# Patient Record
Sex: Female | Born: 1970 | Race: White | Hispanic: No | Marital: Single | Smoking: Former smoker
Health system: Southern US, Community
[De-identification: ages and names within clinical notes are randomized; demographics above are authoritative.]

## PROBLEM LIST (undated history)

## (undated) DIAGNOSIS — F419 Anxiety disorder, unspecified: Secondary | ICD-10-CM

## (undated) DIAGNOSIS — M1990 Unspecified osteoarthritis, unspecified site: Secondary | ICD-10-CM

## (undated) DIAGNOSIS — Z9889 Other specified postprocedural states: Secondary | ICD-10-CM

## (undated) DIAGNOSIS — K219 Gastro-esophageal reflux disease without esophagitis: Secondary | ICD-10-CM

## (undated) DIAGNOSIS — R112 Nausea with vomiting, unspecified: Secondary | ICD-10-CM

## (undated) HISTORY — PX: BREAST SURGERY: SHX581

---

## 1998-01-21 HISTORY — PX: REDUCTION MAMMAPLASTY: SUR839

## 2013-04-21 DIAGNOSIS — F411 Generalized anxiety disorder: Secondary | ICD-10-CM | POA: Insufficient documentation

## 2014-07-06 ENCOUNTER — Ambulatory Visit: Payer: Self-pay | Admitting: Physician Assistant

## 2014-07-06 NOTE — H&P (Signed)
Sheena Evans is an 44 y.o. female.   Chief Complaint: left knee pain HPI: Left knee pain.  Two mechanical events to her knee over 48 hours.  Severe pop in the knees, hard to say where this pop occurred.  It is more central knee but it is also radiating from anterior posterior with some medial pain.    She had a second event as well.  Antalgic gait, difficult ambulation and unable to work.  Works as a phlebotomist.  Has Meloxicam left over from plantar fascia treatment.  We tried her back on.  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  Mechanical events.  Unable to work.  She works on her feet and is a phlebotomist for [employer].    No past medical history on file.  No past surgical history on file.  No family history on file. Social History:  has no tobacco, alcohol, and drug history on file.  Allergies: Allergies not on file   (Not in a hospital admission)  No results found for this or any previous visit (from the past 48 hour(s)). No results found.  Review of Systems  Genitourinary: Positive for dysuria and hematuria.  Musculoskeletal: Positive for joint pain.  All other systems reviewed and are negative.   There were no vitals taken for this visit. Physical Exam  Constitutional: She is oriented to person, place, and time. She appears well-developed and well-nourished. No distress.  HENT:  Head: Normocephalic and atraumatic.  Nose: Nose normal.  Eyes: Conjunctivae and EOM are normal. Pupils are equal, round, and reactive to light.  Neck: Normal range of motion. Neck supple.  Cardiovascular: Normal rate and intact distal pulses.   Respiratory: Effort normal. No respiratory distress. She has no wheezes.  GI: Soft. She exhibits no distension. There is no tenderness.  Musculoskeletal:       Left knee: She exhibits decreased range of motion. She exhibits no erythema, no LCL laxity and no MCL laxity. Tenderness found. Medial joint  line tenderness noted.  Lymphadenopathy:    She has no cervical adenopathy.  Neurological: She is alert and oriented to person, place, and time.  Skin: Skin is warm and dry. No rash noted. No erythema.  Psychiatric: She has a normal mood and affect. Her behavior is normal.     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    I would recommend arthroscopic evaluation.  She understands that this would hopefully be helpful but in the long term she needs to work on her BMI etc.    Total Disability, unable to work.  Hydrocodone now and may upgrade that after surgery.  Will decide on that at the time of surgery.  Risks and benefits discussed for knee arthroscopy, chondroplasty, medial meniscectomy.  Return to see me after surgery.    Sheena Evans 07/06/2014, 4:58 PM    

## 2014-07-11 ENCOUNTER — Encounter (HOSPITAL_BASED_OUTPATIENT_CLINIC_OR_DEPARTMENT_OTHER): Payer: Self-pay

## 2014-07-11 NOTE — Progress Notes (Signed)
Due to patients BMI of 46.95 (with stated weight) patient will come in for anesthesia evaluation. Patient will need weigh also.

## 2014-07-12 ENCOUNTER — Encounter (HOSPITAL_BASED_OUTPATIENT_CLINIC_OR_DEPARTMENT_OTHER): Payer: Self-pay

## 2014-07-12 NOTE — Progress Notes (Signed)
Patient in for anesthesia consult on airway. No problems noted per Dr. Sampson Goon. Weight 272 lbs. Pear Shaped. Airway appears clear.

## 2014-07-13 ENCOUNTER — Ambulatory Visit (HOSPITAL_BASED_OUTPATIENT_CLINIC_OR_DEPARTMENT_OTHER): Payer: 59 | Admitting: Anesthesiology

## 2014-07-13 ENCOUNTER — Encounter (HOSPITAL_BASED_OUTPATIENT_CLINIC_OR_DEPARTMENT_OTHER): Payer: Self-pay | Admitting: Certified Registered"

## 2014-07-13 ENCOUNTER — Encounter (HOSPITAL_BASED_OUTPATIENT_CLINIC_OR_DEPARTMENT_OTHER)
Admission: RE | Disposition: A | Discharge status: Home / Self Care | Payer: Self-pay | Source: Ambulatory Visit | Attending: Orthopedic Surgery

## 2014-07-13 ENCOUNTER — Ambulatory Visit (HOSPITAL_BASED_OUTPATIENT_CLINIC_OR_DEPARTMENT_OTHER)
Admission: RE | Admit: 2014-07-13 | Discharge: 2014-07-13 | Disposition: A | Discharge status: Home / Self Care | Payer: 59 | Source: Ambulatory Visit | Attending: Orthopedic Surgery | Admitting: Orthopedic Surgery

## 2014-07-13 DIAGNOSIS — Z87891 Personal history of nicotine dependence: Secondary | ICD-10-CM | POA: Diagnosis not present

## 2014-07-13 DIAGNOSIS — K219 Gastro-esophageal reflux disease without esophagitis: Secondary | ICD-10-CM | POA: Diagnosis not present

## 2014-07-13 DIAGNOSIS — Y998 Other external cause status: Secondary | ICD-10-CM | POA: Diagnosis not present

## 2014-07-13 DIAGNOSIS — S83242A Other tear of medial meniscus, current injury, left knee, initial encounter: Secondary | ICD-10-CM | POA: Diagnosis present

## 2014-07-13 DIAGNOSIS — Y9389 Activity, other specified: Secondary | ICD-10-CM | POA: Insufficient documentation

## 2014-07-13 DIAGNOSIS — Z6841 Body Mass Index (BMI) 40.0 and over, adult: Secondary | ICD-10-CM | POA: Diagnosis not present

## 2014-07-13 DIAGNOSIS — Y9289 Other specified places as the place of occurrence of the external cause: Secondary | ICD-10-CM | POA: Diagnosis not present

## 2014-07-13 DIAGNOSIS — M2242 Chondromalacia patellae, left knee: Secondary | ICD-10-CM | POA: Insufficient documentation

## 2014-07-13 DIAGNOSIS — X58XXXA Exposure to other specified factors, initial encounter: Secondary | ICD-10-CM | POA: Diagnosis not present

## 2014-07-13 DIAGNOSIS — M1990 Unspecified osteoarthritis, unspecified site: Secondary | ICD-10-CM | POA: Insufficient documentation

## 2014-07-13 HISTORY — DX: Other specified postprocedural states: Z98.890

## 2014-07-13 HISTORY — DX: Gastro-esophageal reflux disease without esophagitis: K21.9

## 2014-07-13 HISTORY — PX: KNEE ARTHROSCOPY WITH MEDIAL MENISECTOMY: SHX5651

## 2014-07-13 HISTORY — DX: Other specified postprocedural states: R11.2

## 2014-07-13 HISTORY — DX: Anxiety disorder, unspecified: F41.9

## 2014-07-13 LAB — POCT HEMOGLOBIN-HEMACUE: Hemoglobin: 14.8 g/dL (ref 12.0–15.0)

## 2014-07-13 SURGERY — ARTHROSCOPY, KNEE, WITH MEDIAL MENISCECTOMY
Anesthesia: General | Site: Knee | Laterality: Left

## 2014-07-13 MED ORDER — CHLORHEXIDINE GLUCONATE 4 % EX LIQD: 60.0000 mL | Freq: Once | CUTANEOUS | Status: DC

## 2014-07-13 MED ORDER — FENTANYL CITRATE (PF) 100 MCG/2ML IJ SOLN
INTRAMUSCULAR | Status: AC
  Filled 2014-07-13: qty 4

## 2014-07-13 MED ORDER — DOCUSATE SODIUM 100 MG PO CAPS: 100.0000 mg | ORAL_CAPSULE | Freq: Two times a day (BID) | ORAL | Status: DC

## 2014-07-13 MED ORDER — MIDAZOLAM HCL 2 MG/2ML IJ SOLN
INTRAMUSCULAR | Status: AC
  Filled 2014-07-13: qty 2

## 2014-07-13 MED ORDER — SODIUM CHLORIDE 0.9 % IV SOLN: INTRAVENOUS | Status: DC

## 2014-07-13 MED ORDER — CEFAZOLIN SODIUM-DEXTROSE 2-3 GM-% IV SOLR
INTRAVENOUS | Status: AC
  Filled 2014-07-13: qty 50

## 2014-07-13 MED ORDER — SCOPOLAMINE 1 MG/3DAYS TD PT72: 1.0000 | MEDICATED_PATCH | Freq: Once | TRANSDERMAL | Status: DC | PRN

## 2014-07-13 MED ORDER — GLYCOPYRROLATE 0.2 MG/ML IJ SOLN: 0.2000 mg | Freq: Once | INTRAMUSCULAR | Status: DC | PRN

## 2014-07-13 MED ORDER — DEXAMETHASONE SODIUM PHOSPHATE 10 MG/ML IJ SOLN
INTRAMUSCULAR | Status: DC | PRN
  Administered 2014-07-13: 10 mg via INTRAVENOUS

## 2014-07-13 MED ORDER — METOCLOPRAMIDE HCL 5 MG PO TABS: 5.0000 mg | ORAL_TABLET | Freq: Three times a day (TID) | ORAL | Status: DC | PRN

## 2014-07-13 MED ORDER — SODIUM CHLORIDE 0.9 % IR SOLN
Status: DC | PRN
  Administered 2014-07-13: 6000 mL

## 2014-07-13 MED ORDER — FENTANYL CITRATE (PF) 100 MCG/2ML IJ SOLN
50.0000 ug | INTRAMUSCULAR | Status: AC | PRN
  Administered 2014-07-13 (×2): 50 ug via INTRAVENOUS
  Administered 2014-07-13: 100 ug via INTRAVENOUS

## 2014-07-13 MED ORDER — EPINEPHRINE HCL 1 MG/ML IJ SOLN
INTRAMUSCULAR | Status: DC | PRN
  Administered 2014-07-13: 2 mL

## 2014-07-13 MED ORDER — PROPOFOL 500 MG/50ML IV EMUL
INTRAVENOUS | Status: AC
  Filled 2014-07-13: qty 100

## 2014-07-13 MED ORDER — LACTATED RINGERS IV SOLN
INTRAVENOUS | Status: DC
  Administered 2014-07-13 (×2): via INTRAVENOUS

## 2014-07-13 MED ORDER — OXYCODONE-ACETAMINOPHEN 5-325 MG PO TABS: 1.0000 | ORAL_TABLET | ORAL | Status: DC | PRN

## 2014-07-13 MED ORDER — ONDANSETRON HCL 4 MG/2ML IJ SOLN
INTRAMUSCULAR | Status: DC | PRN
  Administered 2014-07-13: 4 mg via INTRAVENOUS

## 2014-07-13 MED ORDER — ONDANSETRON HCL 4 MG PO TABS: 4.0000 mg | ORAL_TABLET | Freq: Four times a day (QID) | ORAL | Status: DC | PRN

## 2014-07-13 MED ORDER — CEFAZOLIN SODIUM-DEXTROSE 2-3 GM-% IV SOLR
2.0000 g | INTRAVENOUS | Status: AC
  Administered 2014-07-13: 2 g via INTRAVENOUS

## 2014-07-13 MED ORDER — METOCLOPRAMIDE HCL 5 MG/ML IJ SOLN
5.0000 mg | Freq: Three times a day (TID) | INTRAMUSCULAR | Status: DC | PRN
Start: 2014-07-13 — End: 2014-07-13

## 2014-07-13 MED ORDER — ONDANSETRON HCL 4 MG/2ML IJ SOLN: 4.0000 mg | Freq: Four times a day (QID) | INTRAMUSCULAR | Status: DC | PRN

## 2014-07-13 MED ORDER — PROPOFOL 10 MG/ML IV BOLUS
INTRAVENOUS | Status: DC | PRN
  Administered 2014-07-13: 200 mg via INTRAVENOUS

## 2014-07-13 MED ORDER — MIDAZOLAM HCL 2 MG/2ML IJ SOLN
1.0000 mg | INTRAMUSCULAR | Status: DC | PRN
  Administered 2014-07-13: 2 mg via INTRAVENOUS

## 2014-07-13 MED ORDER — HYDROMORPHONE HCL 1 MG/ML IJ SOLN: 0.5000 mg | INTRAMUSCULAR | Status: DC | PRN

## 2014-07-13 MED ORDER — KETOROLAC TROMETHAMINE 30 MG/ML IJ SOLN
INTRAMUSCULAR | Status: DC | PRN
  Administered 2014-07-13: 30 mg via INTRAVENOUS

## 2014-07-13 SURGICAL SUPPLY — 36 items
BANDAGE ELASTIC 6 VELCRO ST LF (GAUZE/BANDAGES/DRESSINGS) IMPLANT
BANDAGE ESMARK 6X9 LF (GAUZE/BANDAGES/DRESSINGS) IMPLANT
BLADE 4.2CUDA (BLADE) ×2 IMPLANT
BLADE CUDA GRT WHITE 3.5 (BLADE) IMPLANT
BLADE GREAT WHITE 4.2 (BLADE) ×2 IMPLANT
BNDG ESMARK 6X9 LF (GAUZE/BANDAGES/DRESSINGS)
BNDG GAUZE ELAST 4 BULKY (GAUZE/BANDAGES/DRESSINGS) ×2 IMPLANT
BRUSH SCRUB EZ PLAIN DRY (MISCELLANEOUS) ×2 IMPLANT
DRAPE ARTHROSCOPY W/POUCH 90 (DRAPES) ×4 IMPLANT
DRSG EMULSION OIL 3X3 NADH (GAUZE/BANDAGES/DRESSINGS) ×2 IMPLANT
DURAPREP 26ML APPLICATOR (WOUND CARE) ×4 IMPLANT
GAUZE SPONGE 4X4 12PLY STRL (GAUZE/BANDAGES/DRESSINGS) ×2 IMPLANT
GLOVE BIO SURGEON STRL SZ7.5 (GLOVE) ×2 IMPLANT
GLOVE BIOGEL PI IND STRL 8 (GLOVE) ×2 IMPLANT
GLOVE BIOGEL PI INDICATOR 8 (GLOVE) ×2
GLOVE SURG ORTHO 8.0 STRL STRW (GLOVE) ×2 IMPLANT
GOWN STRL REUS W/ TWL LRG LVL3 (GOWN DISPOSABLE) ×1 IMPLANT
GOWN STRL REUS W/ TWL XL LVL3 (GOWN DISPOSABLE) ×1 IMPLANT
GOWN STRL REUS W/TWL LRG LVL3 (GOWN DISPOSABLE) ×1
GOWN STRL REUS W/TWL XL LVL3 (GOWN DISPOSABLE) ×1
HOLDER KNEE FOAM BLUE (MISCELLANEOUS) IMPLANT
KNEE WRAP E Z 3 GEL PACK (MISCELLANEOUS) ×2 IMPLANT
MANIFOLD NEPTUNE II (INSTRUMENTS) ×2 IMPLANT
NDL SAFETY ECLIPSE 18X1.5 (NEEDLE) ×1 IMPLANT
NEEDLE HYPO 18GX1.5 SHARP (NEEDLE) ×1
PACK ARTHROSCOPY DSU (CUSTOM PROCEDURE TRAY) ×2 IMPLANT
PACK BASIN DAY SURGERY FS (CUSTOM PROCEDURE TRAY) ×2 IMPLANT
SET ARTHROSCOPY TUBING (MISCELLANEOUS) ×1
SET ARTHROSCOPY TUBING LN (MISCELLANEOUS) ×1 IMPLANT
SUT ETHILON 4 0 PS 2 18 (SUTURE) ×2 IMPLANT
SYR 5ML LL (SYRINGE) IMPLANT
TOWEL OR 17X24 6PK STRL BLUE (TOWEL DISPOSABLE) ×2 IMPLANT
WAND 3.0 CAPSURE 30 DEG W/CORD (SURGICAL WAND) IMPLANT
WAND 30 DEG SABER W/CORD (SURGICAL WAND) IMPLANT
WAND STAR VAC 90 (SURGICAL WAND) IMPLANT
WATER STERILE IRR 1000ML POUR (IV SOLUTION) ×2 IMPLANT

## 2014-07-13 NOTE — Transfer of Care (Signed)
Immediate Anesthesia Transfer of Care Note  Patient: Sheena Evans  Procedure(s) Performed: Procedure(s): LEFT KNEE SCOPE WITH MEDIAL MENISECTOMY WITH  AND PATELLA DEBRIDEMENT DEBRIDEMENT (Left)  Patient Location: PACU  Anesthesia Type:General  Level of Consciousness: awake, alert  and oriented  Airway & Oxygen Therapy: Patient Spontanous Breathing and Patient connected to face mask oxygen  Post-op Assessment: Report given to RN and Post -op Vital signs reviewed and stable  Post vital signs: Reviewed and stable  Last Vitals:  Filed Vitals:   07/13/14 1320  BP: 130/77  Pulse: 71  Temp: 37.1 C  Resp: 20    Complications: No apparent anesthesia complications

## 2014-07-13 NOTE — Anesthesia Preprocedure Evaluation (Signed)
Anesthesia Evaluation  Patient identified by MRN, date of birth, ID band Patient awake    History of Anesthesia Complications (+) PONV  Airway Mallampati: II       Dental  (+) Teeth Intact   Pulmonary neg pulmonary ROS, former smoker,  breath sounds clear to auscultation        Cardiovascular negative cardio ROS  Rhythm:Regular Rate:Normal     Neuro/Psych    GI/Hepatic GERD-  ,  Endo/Other  Morbid obesity  Renal/GU      Musculoskeletal  (+) Arthritis -,   Abdominal (+) + obese,   Peds  Hematology   Anesthesia Other Findings   Reproductive/Obstetrics                             Anesthesia Physical Anesthesia Plan  ASA: II  Anesthesia Plan: General   Post-op Pain Management:    Induction: Intravenous  Airway Management Planned: LMA and Oral ETT  Additional Equipment:   Intra-op Plan:   Post-operative Plan: Extubation in OR  Informed Consent: I have reviewed the patients History and Physical, chart, labs and discussed the procedure including the risks, benefits and alternatives for the proposed anesthesia with the patient or authorized representative who has indicated his/her understanding and acceptance.   Dental advisory given  Plan Discussed with: CRNA and Surgeon  Anesthesia Plan Comments:         Anesthesia Quick Evaluation

## 2014-07-13 NOTE — Interval H&P Note (Signed)
History and Physical Interval Note:  07/13/2014 11:51 AM  Sheena Evans  has presented today for surgery, with the diagnosis of LEFT KNEE MEDIAL MENISCUS TEAR/CHONDROMYLACIA  The various methods of treatment have been discussed with the patient and family. After consideration of risks, benefits and other options for treatment, the patient has consented to  Procedure(s): LEFT KNEE SCOPE WITH MEDIAL MENISECTOMY WITH DEBRIDEMENT (Left) as a surgical intervention .  The patient's history has been reviewed, patient examined, no change in status, stable for surgery.  I have reviewed the patient's chart and labs.  Questions were answered to the patient's satisfaction.     Celia Friedland JR,W D

## 2014-07-13 NOTE — Brief Op Note (Signed)
07/13/2014  2:39 PM  PATIENT:  Joesph Fillers  44 y.o. female  PRE-OPERATIVE DIAGNOSIS:  LEFT KNEE MEDIAL MENISCUS TEAR/CHONDROMYLACIA  POST-OPERATIVE DIAGNOSIS:  LEFT KNEE MEDIAL MENISCUS TEAR/CHONDROMYLACIA  PROCEDURE:  Procedure(s): LEFT KNEE SCOPE WITH MEDIAL MENISECTOMY WITH  AND PATELLA DEBRIDEMENT DEBRIDEMENT (Left)  SURGEON:  Surgeon(s) and Role:    * Frederico Hamman, MD - Primary  PHYSICIAN ASSISTANT:   ASSISTANTS:  ANESTHESIA:   general  EBL:  Total I/O In: 1000 [I.V.:1000] Out: -   BLOOD ADMINISTERED:none  DRAINS: none   LOCAL MEDICATIONS USED:  NONE  SPECIMEN:  No Specimen  DISPOSITION OF SPECIMEN:  N/A  COUNTS:  YES  TOURNIQUET:  * No tourniquets in log *  DICTATION: .Other Dictation: Dictation Number unknown  PLAN OF CARE: Discharge to home after PACU  PATIENT DISPOSITION:  PACU - hemodynamically stable.   Delay start of Pharmacological VTE agent (>24hrs) due to surgical blood loss or risk of bleeding: not applicable

## 2014-07-13 NOTE — Anesthesia Procedure Notes (Signed)
Procedure Name: LMA Insertion Date/Time: 07/13/2014 1:40 PM Performed by: Zenia Resides D Pre-anesthesia Checklist: Patient identified, Emergency Drugs available, Suction available and Patient being monitored Patient Re-evaluated:Patient Re-evaluated prior to inductionOxygen Delivery Method: Circle System Utilized Preoxygenation: Pre-oxygenation with 100% oxygen Intubation Type: IV induction Ventilation: Mask ventilation without difficulty LMA: LMA inserted LMA Size: 4.0 Number of attempts: 1 Airway Equipment and Method: Bite block Placement Confirmation: positive ETCO2 Tube secured with: Tape Dental Injury: Teeth and Oropharynx as per pre-operative assessment

## 2014-07-13 NOTE — Discharge Instructions (Signed)
Diet: As you were doing prior to hospitalization   Activity: Increase activity slowly as tolerated  No lifting or driving for 48 hours   Shower: May shower without a dressing on post op day #2, NO SOAKING in tub   Dressing: You may change your dressing on post op day #2.  Then change the dressing daily with sterile band aids  Weight Bearing: weight bearing as tolerated  To prevent constipation: you may use a stool softener such as -  Colace ( over the counter) 100 mg by mouth twice a day  Drink plenty of fluids ( prune juice may be helpful) and high fiber foods  Miralax ( over the counter) for constipation as needed.   Precautions: If you experience chest pain or shortness of breath - call 911 immediately For transfer to the hospital emergency department!!  If you develop a fever greater that 101 F, purulent drainage from wound, increased redness or drainage from wound, or calf pain -- Call the office   Follow- Up Appointment: Please call for an appointment to be seen in 1 week  [place] - [phone]    Post Anesthesia Home Care Instructions  Activity: Get plenty of rest for the remainder of the day. A responsible adult should stay with you for 24 hours following the procedure.  For the next 24 hours, DO NOT: -Drive a car -Advertising copywriter -Drink alcoholic beverages -Take any medication unless instructed by your physician -Make any legal decisions or sign important papers.  Meals: Start with liquid foods such as gelatin or soup. Progress to regular foods as tolerated. Avoid greasy, spicy, heavy foods. If nausea and/or vomiting occur, drink only clear liquids until the nausea and/or vomiting subsides. Call your physician if vomiting continues.  Special Instructions/Symptoms: Your throat may feel dry or sore from the anesthesia or the breathing tube placed in your throat during surgery. If this causes discomfort, gargle with warm salt water. The discomfort should disappear  within 24 hours.  If you had a scopolamine patch placed behind your ear for the management of post- operative nausea and/or vomiting:  1. The medication in the patch is effective for 72 hours, after which it should be removed.  Wrap patch in a tissue and discard in the trash. Wash hands thoroughly with soap and water. 2. You may remove the patch earlier than 72 hours if you experience unpleasant side effects which may include dry mouth, dizziness or visual disturbances. 3. Avoid touching the patch. Wash your hands with soap and water after contact with the patch.   Call your surgeon if you experience:   1.  Fever over 101.0. 2.  Inability to urinate. 3.  Nausea and/or vomiting. 4.  Extreme swelling or bruising at the surgical site. 5.  Continued bleeding from the incision. 6.  Increased pain, redness or drainage from the incision. 7.  Problems related to your pain medication. 8. Any change in color, movement and/or sensation 9. Any problems and/or concerns

## 2014-07-13 NOTE — H&P (View-Only) (Signed)
Sheena Evans is an 44 y.o. female.   Chief Complaint: left knee pain HPI: Left knee pain.  Two mechanical events to her knee over 48 hours.  Severe pop in the knees, hard to say where this pop occurred.  It is more central knee but it is also radiating from anterior posterior with some medial pain.    She had a second event as well.  Antalgic gait, difficult ambulation and unable to work.  Works as a Water quality scientist.  Has Meloxicam left over from plantar fascia treatment.  We tried her back on.  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  Mechanical events.  Unable to work.  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    No past medical history on file.  No past surgical history on file.  No family history on file. Social History:  has no tobacco, alcohol, and drug history on file.  Allergies: Allergies not on file   (Not in a hospital admission)  No results found for this or any previous visit (from the past 48 hour(s)). No results found.  Review of Systems  Genitourinary: Positive for dysuria and hematuria.  Musculoskeletal: Positive for joint pain.  All other systems reviewed and are negative.   There were no vitals taken for this visit. Physical Exam  Constitutional: She is oriented to person, place, and time. She appears well-developed and well-nourished. No distress.  HENT:  Head: Normocephalic and atraumatic.  Nose: Nose normal.  Eyes: Conjunctivae and EOM are normal. Pupils are equal, round, and reactive to light.  Neck: Normal range of motion. Neck supple.  Cardiovascular: Normal rate and intact distal pulses.   Respiratory: Effort normal. No respiratory distress. She has no wheezes.  GI: Soft. She exhibits no distension. There is no tenderness.  Musculoskeletal:       Left knee: She exhibits decreased range of motion. She exhibits no erythema, no LCL laxity and no MCL laxity. Tenderness found. Medial joint  line tenderness noted.  Lymphadenopathy:    She has no cervical adenopathy.  Neurological: She is alert and oriented to person, place, and time.  Skin: Skin is warm and dry. No rash noted. No erythema.  Psychiatric: She has a normal mood and affect. Her behavior is normal.     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    I would recommend arthroscopic evaluation.  She understands that this would hopefully be helpful but in the long term she needs to work on her BMI etc.    Total Disability, unable to work.  Hydrocodone now and may upgrade that after surgery.  Will decide on that at the time of surgery.  Risks and benefits discussed for knee arthroscopy, chondroplasty, medial meniscectomy.  Return to see me after surgery.    Margart Sickles 07/06/2014, 4:58 PM

## 2014-07-14 ENCOUNTER — Encounter (HOSPITAL_BASED_OUTPATIENT_CLINIC_OR_DEPARTMENT_OTHER): Payer: Self-pay | Admitting: Orthopedic Surgery

## 2014-07-15 NOTE — Op Note (Signed)
NAME:  Earley Brooke NO.:  [id number]  MEDICAL RECORD NO.:  [id number]  LOCATION:                               FACILITY:  [facility]  PHYSICIAN:  Dyke Brackett, M.D.    DATE OF BIRTH:  [birth year]  DATE OF PROCEDURE: DATE OF DISCHARGE:                              OPERATIVE REPORT   INDICATIONS:  Left knee pain.  MRI proven left knee medial meniscal root tear thought to be amenable to outpatient surgery.  PREOPERATIVE DIAGNOSIS: 1. Meniscal root tear, left posterior horn medial meniscus. 2. Chondromalacia of patella, grade 3.  POSTOPERATIVE DIAGNOSIS: 1. Meniscal root tear, left posterior horn medial meniscus. 2. Chondromalacia of patella, grade 3.  OPERATION: 1. Partial meniscectomy of posterior horn. 2. Debridement, chondroplasty of patella.  SURGEON:  Dyke Brackett, M.D.  ANESTHETIC:  General anesthetic with local supplementation.  DESCRIPTION OF PROCEDURE:  Left knee sterile prep and drape with no tourniquet.  We made inferomedial and inferolateral portals.  Moderate chondromalacia of patella was noted on the patellofemoral area, particularly on the patellar facet which was debrided, grade 3.  Most of the medial facet was debrided.  Lateral compartment, lateral meniscus normal.  Root tear noted on the posterior horn of the medial meniscus __________.  To be able to get this stabilized, we had to resect most of the posterior horn of the medial meniscus, which subluxed into the joint.  There was some very mild chondral surface seen on both sides of the joints surrounding this, which were debrided, described as grade 2 changes as opposed to grade 3 on the patellofemoral area __________ chondroplasty of patellofemoral joint was __________ the medial compartment.  Partial medial meniscectomy, probably resected about 30-40% of the meniscus substance.  Knee drained fluid.  Portals infiltrated with 0.5% Marcaine, approximately 15 mL with approximately 15 mL  of 80 mg of Depo-Medrol intra-articularly.  Taken to the recovery in stable condition.     Dyke Brackett, M.D.     WDC/MEDQ  D:  07/13/2014  T:  07/14/2014  Job:  [id number]

## 2014-07-18 NOTE — Anesthesia Postprocedure Evaluation (Signed)
  Anesthesia Post-op Note  Patient: Sheena Evans  Procedure(s) Performed: Procedure(s): LEFT KNEE SCOPE WITH MEDIAL MENISECTOMY WITH  AND PATELLA DEBRIDEMENT DEBRIDEMENT (Left)  Patient Location: PACU  Anesthesia Type:General  Level of Consciousness: awake and alert   Airway and Oxygen Therapy: Patient Spontanous Breathing  Post-op Pain: mild  Post-op Assessment: Post-op Vital signs reviewed LLE Motor Response:  (will assess when awake)            Post-op Vital Signs: stable  Last Vitals:  Filed Vitals:   07/13/14 1600  BP: 141/81  Pulse: 88  Temp: 36.7 C  Resp: 16    Complications: No apparent anesthesia complications

## 2014-11-04 DIAGNOSIS — S83209A Unspecified tear of unspecified meniscus, current injury, unspecified knee, initial encounter: Secondary | ICD-10-CM | POA: Insufficient documentation

## 2014-11-04 DIAGNOSIS — R600 Localized edema: Secondary | ICD-10-CM | POA: Insufficient documentation

## 2014-11-04 DIAGNOSIS — M25561 Pain in right knee: Secondary | ICD-10-CM | POA: Insufficient documentation

## 2016-05-25 ENCOUNTER — Emergency Department (HOSPITAL_BASED_OUTPATIENT_CLINIC_OR_DEPARTMENT_OTHER): Payer: BLUE CROSS/BLUE SHIELD

## 2016-05-25 ENCOUNTER — Encounter (HOSPITAL_BASED_OUTPATIENT_CLINIC_OR_DEPARTMENT_OTHER): Payer: Self-pay | Admitting: Emergency Medicine

## 2016-05-25 ENCOUNTER — Emergency Department (HOSPITAL_BASED_OUTPATIENT_CLINIC_OR_DEPARTMENT_OTHER)
Admission: EM | Admit: 2016-05-25 | Discharge: 2016-05-25 | Disposition: A | Discharge status: Home / Self Care | Payer: BLUE CROSS/BLUE SHIELD | Attending: Emergency Medicine | Admitting: Emergency Medicine

## 2016-05-25 DIAGNOSIS — Z87891 Personal history of nicotine dependence: Secondary | ICD-10-CM | POA: Insufficient documentation

## 2016-05-25 DIAGNOSIS — Y929 Unspecified place or not applicable: Secondary | ICD-10-CM | POA: Insufficient documentation

## 2016-05-25 DIAGNOSIS — Y999 Unspecified external cause status: Secondary | ICD-10-CM | POA: Insufficient documentation

## 2016-05-25 DIAGNOSIS — S40212A Abrasion of left shoulder, initial encounter: Secondary | ICD-10-CM | POA: Insufficient documentation

## 2016-05-25 DIAGNOSIS — S0081XA Abrasion of other part of head, initial encounter: Secondary | ICD-10-CM | POA: Insufficient documentation

## 2016-05-25 DIAGNOSIS — Y939 Activity, unspecified: Secondary | ICD-10-CM | POA: Insufficient documentation

## 2016-05-25 DIAGNOSIS — M25512 Pain in left shoulder: Secondary | ICD-10-CM

## 2016-05-25 DIAGNOSIS — W19XXXA Unspecified fall, initial encounter: Secondary | ICD-10-CM

## 2016-05-25 DIAGNOSIS — W1809XA Striking against other object with subsequent fall, initial encounter: Secondary | ICD-10-CM | POA: Insufficient documentation

## 2016-05-25 DIAGNOSIS — S4992XA Unspecified injury of left shoulder and upper arm, initial encounter: Secondary | ICD-10-CM | POA: Diagnosis present

## 2016-05-25 HISTORY — DX: Unspecified osteoarthritis, unspecified site: M19.90

## 2016-05-25 MED ORDER — ACETAMINOPHEN 500 MG PO TABS
1000.0000 mg | ORAL_TABLET | Freq: Once | ORAL | Status: AC
  Administered 2016-05-25: 1000 mg via ORAL
  Filled 2016-05-25: qty 2

## 2016-05-25 MED ORDER — PROCHLORPERAZINE MALEATE 10 MG PO TABS
10.0000 mg | ORAL_TABLET | Freq: Once | ORAL | Status: AC
  Administered 2016-05-25: 10 mg via ORAL
  Filled 2016-05-25: qty 1

## 2016-05-25 NOTE — ED Provider Notes (Signed)
[facility] Provider Note   CSN: [id number] Arrival date & time: 05/25/16  1503   By signing my name below, I, Soijett Blue, attest that this documentation has been prepared under the direction and in the presence of Sharen Heck, PA-C Electronically Signed: Soijett Blue, ED Scribe. 05/25/16. 6:42 PM.  History   Chief Complaint Chief Complaint  Patient presents with  . Fall    HPI Sheena Evans is a 46 y.o. female who presents to the Emergency Department complaining of a fall onset 12 PM today. Pt notes that she was carrying a cat carrier up stairs when she fell to her left landing on her left shoulder striking the concrete. Pt reports associated mildly and improving left sided and frontal HA, left shoulder pain, mild right knee pain, and abrasion to left shoulder and left cheek. Pt has not tried any medications for the relief of her symptoms. She denies LOC, dizziness, lightheadedness, CP, SOB, blurred vision prior to fall.  Patient denies nausea, vomiting, changes in vision, memory deficits after fall.  Denies taking anticoagulants.    The history is provided by the patient. No language interpreter was used.    Past Medical History:  Diagnosis Date  . Anxiety   . Arthritis   . GERD (gastroesophageal reflux disease)   . PONV (postoperative nausea and vomiting)     There are no active problems to display for this patient.   Past Surgical History:  Procedure Laterality Date  . BREAST SURGERY     Breast reduction 2002  . KNEE ARTHROSCOPY WITH MEDIAL MENISECTOMY Left 07/13/2014   Procedure: LEFT KNEE SCOPE WITH MEDIAL MENISECTOMY WITH  AND PATELLA DEBRIDEMENT DEBRIDEMENT;  Surgeon: Frederico Hamman, MD;  Location: [facility];  Service: Orthopedics;  Laterality: Left;    OB History    No data available       Home Medications    Prior to Admission medications   Medication Sig Start Date End Date Taking? Authorizing Provider  diclofenac  (VOLTAREN) 75 MG EC tablet Take 75 mg by mouth 2 (two) times daily.   Yes [provider]  traMADol (ULTRAM) 50 MG tablet Take by mouth every 6 (six) hours as needed.   Yes [provider]    Family History Family History  Problem Relation Age of Onset  . Diabetes Mother   . Hypertension Mother   . Thyroid disease Mother   . Cancer Maternal Grandmother   . Diabetes Maternal Grandmother   . Thyroid disease Maternal Grandfather   . Rheum arthritis Neg Hx   . Osteoarthritis Neg Hx   . Asthma Neg Hx     Social History Social History  Substance Use Topics  . Smoking status: Former Smoker    Packs/day: 0.50    Years: 1.00    Types: Cigarettes    Quit date: 04/10/2014  . Smokeless tobacco: Never Used  . Alcohol use Yes     Comment: social     Allergies   Biaxin [clarithromycin]   Review of Systems Review of Systems  Eyes: Negative for visual disturbance.  Respiratory: Negative for shortness of breath.   Cardiovascular: Negative for chest pain.  Gastrointestinal: Negative for nausea.  Musculoskeletal: Positive for arthralgias (left shoulder and right knee). Negative for gait problem and joint swelling.  Skin: Positive for wound (abrasion to left cheek and left shoulder).  Neurological: Positive for headaches. Negative for dizziness, syncope and light-headedness.     Physical Exam Updated Vital Signs BP  123/81 (BP Location: Right Arm)   Pulse 96   Temp 98.4 F (36.9 C) (Oral)   Resp 20   Ht 5\' 5"  (1.651 m)   Wt 270 lb (122.5 kg)   SpO2 98%   BMI 44.93 kg/m   Physical Exam  Constitutional: She is oriented to person, place, and time. She appears well-developed and well-nourished.  HENT:  Head: Normocephalic. Head is with abrasion.  Mouth/Throat: Oropharynx is clear and moist. No oropharyngeal exudate.  Tenderness to left frontal bone and left zygomatic bone without crepitus No parietal or occipital bone tenderness or crepitus  Eyes:  Conjunctivae and EOM are normal. Pupils are equal, round, and reactive to light.  Neck: Normal range of motion. Neck supple. No JVD present.  No midline cervical spine tenderness Full active ROM of cervical spine without pain  Cardiovascular: Normal rate, regular rhythm, normal heart sounds and intact distal pulses.   No murmur heard. Pulmonary/Chest: Effort normal and breath sounds normal. She has no wheezes. She exhibits no tenderness.  Musculoskeletal: Normal range of motion. She exhibits tenderness. She exhibits no deformity.       Left shoulder: She exhibits tenderness.  +Point tenderness to left clavicle, AC joint, and anterior left shoulder.  Full passive ROM of left shoulder with mild reported pain with flexion, no crepitus.   Left wrist is non tender, no scaphoid tenderness, no MCP tenderness.  Full active ROM of left wrist and digits without pain.  No obvious deformity of right knee including edema, erythema or effusion.  Full passive ROM of right knee with normal patellar J tracking. No overlaying skin abrasions, ecchymosis or bleeding. No medial or lateral joint line tenderness.   No bony tenderness over patella, fibular head or tibial tuberosity.   No tenderness over MCL, LCL, patellar tendon or quadriceps tendon.    Negative Lachman's. Negative posterior drawer test.  Negative McMurray's. No varus or valgus laxity.  No crepitus with knee ROM.  Patient able to bear weight in ED (4+ steps)  Lymphadenopathy:    She has no cervical adenopathy.  Neurological: She is alert and oriented to person, place, and time. She has normal strength. No sensory deficit. Coordination normal.  Pt is alert and oriented.   Speech and phonation normal.   Thought process coherent.   Strength 5/5 in upper and lower extremities.   Sensation to light touch intact in upper and lower extremities.  Gait normal.   Negative Romberg. No leg drift.  Intact finger to nose test. CN I not tested CN II full  visual fields  CN III, IV, VI PEERL and EOM intact CN V light touch intact in all 3 divisions of trigeminal nerve CN VII facial nerve movements intact, symmetric CN VIII hearing intact to finger rub CN IX, X no uvula deviation, symmetric soft palate rise CN XI 5/5 SCM and trapezius strength  CN XII Tongue midline with symmetric L/R movement  Skin: Skin is warm and dry. Capillary refill takes less than 2 seconds. Abrasion noted.  Small abrasion to left shoulder and left knuckles and left zygomatic bone.   Psychiatric: She has a normal mood and affect. Her behavior is normal. Judgment and thought content normal.  Nursing note and vitals reviewed.    ED Treatments / Results  DIAGNOSTIC STUDIES: Oxygen Saturation is 98% on RA, nl by my interpretation.    COORDINATION OF CARE: 4:29 PM Discussed treatment plan with pt at bedside which includes left shoulder xray, left clavicle xray and  pt agreed to plan.   Labs (all labs ordered are listed, but only abnormal results are displayed) Labs Reviewed - No data to display  EKG  EKG Interpretation None       Radiology Dg Clavicle Left  Result Date: 05/25/2016 CLINICAL DATA:  Fall. Left shoulder pain and clavicle point tenderness EXAM: LEFT CLAVICLE - 2+ VIEWS COMPARISON:  None. FINDINGS: There is no evidence of fracture or other focal bone lesions. Soft tissues are unremarkable. IMPRESSION: Negative. Electronically Signed   By: Signa Kellaylor  Stroud M.D.   On: 05/25/2016 17:55   Dg Shoulder Left  Result Date: 05/25/2016 CLINICAL DATA:  Pain after fall EXAM: LEFT SHOULDER - 2+ VIEW COMPARISON:  None. FINDINGS: There is no evidence of fracture or dislocation. There is no evidence of arthropathy or other focal bone abnormality. Soft tissues are unremarkable. IMPRESSION: Negative. Electronically Signed   By: Gerome Samavid  Williams III M.D   On: 05/25/2016 17:47    Procedures Procedures (including critical care time)  Medications Ordered in  ED Medications  acetaminophen (TYLENOL) tablet 1,000 mg (1,000 mg Oral Given 05/25/16 1650)  prochlorperazine (COMPAZINE) tablet 10 mg (10 mg Oral Given 05/25/16 1655)     Initial Impression / Assessment and Plan / ED Course  I have reviewed the triage vital signs and the nursing notes.  Pertinent labs & imaging results that were available during my care of the patient were reviewed by me and considered in my medical decision making (see chart for details).    46 year old female presents with acute left shoulder pain and the left zygomatic bone pain with mild, improving headache after mechanical fall. Patient notes she was hurrying up some steps and thinks she tripped and fell. No preceding chest pain, shortness of breath, lightheadedness. No nausea, vomiting, LOC, changes in vision after fall. No anticoagulants. On exam patient is well-appearing. She is got small abrasions to the left zygomatic bone, left lateral shoulder and left knuckles. Neurological exam is normal. Cervical spine was cleared by nexus criteria. Head was cleared with CT Canadian head rule. No anatomical snuffbox tenderness.  We will get left shoulder and left clavicle x-rays as she has point tenderness to distal clavicle and AC joint.  X-rays negative for acute bony injuries. Suspect normal muscular soreness after fall. Will discharge with NSAIDs, ice, rest. ED return precautions given. Patient agreeable with plan.   Final Clinical Impressions(s) / ED Diagnoses   Final diagnoses:  Fall, initial encounter  Acute pain of left shoulder    New Prescriptions New Prescriptions   No medications on file   I personally performed the services described in this documentation, which was scribed in my presence. The recorded information has been reviewed and is accurate.     Liberty HandyGibbons, Shykeem Resurreccion J, PA-C 05/25/16 1842    Raeford RazorKohut, Stephen, MD 05/27/16 1029

## 2016-05-25 NOTE — Discharge Instructions (Signed)
Your x-rays were negative today. I suspect your pain is secondary to a contusion ("bruise") from your fall.    Please take tylenol 1000 mg for pain and inflammation every 6-8 hours. Ice and rest.   Contact your primary care provider if your symptoms worsen.

## 2016-05-25 NOTE — ED Triage Notes (Addendum)
Larey SeatFell today carrying cat around 1200, unsure what causes the fall. Pain to head, left shoulder, right knee. Denies LOC , abrasion noted to left cheek

## 2016-07-03 ENCOUNTER — Other Ambulatory Visit: Payer: Self-pay | Admitting: Orthopedic Surgery

## 2016-07-03 DIAGNOSIS — M21371 Foot drop, right foot: Secondary | ICD-10-CM

## 2016-07-03 DIAGNOSIS — M545 Low back pain, unspecified: Secondary | ICD-10-CM

## 2016-07-13 ENCOUNTER — Ambulatory Visit
Admission: RE | Admit: 2016-07-13 | Discharge: 2016-07-13 | Disposition: A | Discharge status: Home / Self Care | Payer: BLUE CROSS/BLUE SHIELD | Source: Ambulatory Visit | Attending: Orthopedic Surgery | Admitting: Orthopedic Surgery

## 2016-07-13 DIAGNOSIS — M545 Low back pain, unspecified: Secondary | ICD-10-CM

## 2016-07-13 DIAGNOSIS — M21371 Foot drop, right foot: Secondary | ICD-10-CM

## 2016-12-16 ENCOUNTER — Ambulatory Visit (INDEPENDENT_AMBULATORY_CARE_PROVIDER_SITE_OTHER): Payer: BLUE CROSS/BLUE SHIELD | Admitting: Podiatry

## 2016-12-16 ENCOUNTER — Ambulatory Visit (INDEPENDENT_AMBULATORY_CARE_PROVIDER_SITE_OTHER): Payer: BLUE CROSS/BLUE SHIELD

## 2016-12-16 ENCOUNTER — Ambulatory Visit: Payer: BLUE CROSS/BLUE SHIELD | Admitting: Orthotics

## 2016-12-16 ENCOUNTER — Other Ambulatory Visit: Payer: Self-pay | Admitting: Podiatry

## 2016-12-16 ENCOUNTER — Encounter: Payer: Self-pay | Admitting: Podiatry

## 2016-12-16 DIAGNOSIS — M2141 Flat foot [pes planus] (acquired), right foot: Secondary | ICD-10-CM | POA: Diagnosis not present

## 2016-12-16 DIAGNOSIS — M722 Plantar fascial fibromatosis: Secondary | ICD-10-CM

## 2016-12-16 DIAGNOSIS — M79672 Pain in left foot: Secondary | ICD-10-CM

## 2016-12-16 DIAGNOSIS — M2142 Flat foot [pes planus] (acquired), left foot: Secondary | ICD-10-CM

## 2016-12-16 DIAGNOSIS — M79671 Pain in right foot: Secondary | ICD-10-CM | POA: Diagnosis not present

## 2016-12-16 NOTE — Progress Notes (Signed)
Patient was seen today to be evaluated/cast for cmfo.  Patient is new patient who complains of foot pain at insertion calcaneal tuberosity R, also discomfort extending superior/pos to medial mall (post tib?).  Goal is provide arch support, rear foot stability.  Plan on Richy to fab device to accomplish such w/ a 4* varus RF post.   Dr. Bea LauraE to drop charges.

## 2016-12-16 NOTE — Progress Notes (Signed)
See dr. Bea LauraE notes on his schedule today... Dr. Bea LauraE to drop charges.

## 2016-12-19 ENCOUNTER — Other Ambulatory Visit: Payer: Self-pay | Admitting: Obstetrics and Gynecology

## 2016-12-19 DIAGNOSIS — N6459 Other signs and symptoms in breast: Secondary | ICD-10-CM

## 2016-12-19 NOTE — Progress Notes (Signed)
   Subjective: Patient presents today for pain and tenderness in the right plantar heel that has been ongoing for at least the past three months. She reports also reports pain to the lower calf region. Patient states that her right foot hurts in the mornings with the first steps out of bed. There are no alleviating factors noted. She has been taking Diclofenac previously prescribed to her for arthritis. She reports h/o left knee surgery. Patient presents today for further treatment and evaluation.   Past Medical History:  Diagnosis Date  . Anxiety   . Arthritis   . GERD (gastroesophageal reflux disease)   . PONV (postoperative nausea and vomiting)      Objective: Physical Exam General: The patient is alert and oriented x3 in no acute distress.  Dermatology: Skin is warm, dry and supple bilateral lower extremities. Negative for open lesions or macerations bilateral.   Vascular: Dorsalis Pedis and Posterior Tibial pulses palpable bilateral.  Capillary fill time is immediate to all digits.  Neurological: Epicritic and protective threshold intact bilateral.   Musculoskeletal: Tenderness to palpation at the medial calcaneal tubercale and through the insertion of the plantar fascia of the right foot. All other joints range of motion within normal limits bilateral. Strength 5/5 in all groups bilateral.   Radiographic exam: Normal osseous mineralization. Joint spaces preserved. No fracture/dislocation/boney destruction. Calcaneal spur present with mild thickening of plantar fascia right. No other soft tissue abnormalities or radiopaque foreign bodies.   Assessment: 1. Plantar fasciitis right 2. Pain in right foot 3. Left calf pain/cramping  Plan of Care:  1. Patient evaluated. Xrays reviewed.   2. Injection of 0.5cc Celestone soluspan injected into the right plantar fascia  3. Continue taking Diclofenac as prescribed for arthritic pain.  4. Recommended daily stretching to the left lower  extremity and compression stockings.  5. Appointment with Raiford Nobleick for custom molded orthotics.  6. Return to clinic in 4 weeks.     Felecia ShellingBrent M. Maurita Havener, DPM [facility]  Dr. Felecia ShellingBrent M. Liani Caris, DPM    [address]                                        [place], [place] [address]                Office [phone]  Fax [phone]

## 2017-01-15 ENCOUNTER — Ambulatory Visit
Admission: RE | Admit: 2017-01-15 | Discharge: 2017-01-15 | Disposition: A | Discharge status: Home / Self Care | Payer: BLUE CROSS/BLUE SHIELD | Source: Ambulatory Visit | Attending: Obstetrics and Gynecology | Admitting: Obstetrics and Gynecology

## 2017-01-15 ENCOUNTER — Encounter: Payer: Self-pay | Admitting: Podiatry

## 2017-01-15 ENCOUNTER — Ambulatory Visit (INDEPENDENT_AMBULATORY_CARE_PROVIDER_SITE_OTHER): Payer: BLUE CROSS/BLUE SHIELD | Admitting: Podiatry

## 2017-01-15 ENCOUNTER — Ambulatory Visit: Payer: BLUE CROSS/BLUE SHIELD | Admitting: Podiatry

## 2017-01-15 ENCOUNTER — Ambulatory Visit: Payer: BLUE CROSS/BLUE SHIELD | Admitting: Orthotics

## 2017-01-15 DIAGNOSIS — N6459 Other signs and symptoms in breast: Secondary | ICD-10-CM

## 2017-01-15 DIAGNOSIS — M722 Plantar fascial fibromatosis: Secondary | ICD-10-CM

## 2017-01-15 NOTE — Progress Notes (Signed)
Patient came in today to pick up custom made foot orthotics.  The goals were accomplished and the patient reported no dissatisfaction with said orthotics.  Patient was advised of breakin period and how to report any issues. 

## 2017-01-22 NOTE — Progress Notes (Signed)
   Subjective: Patient presents today for follow-up evaluation of right plantar fasciitis.  She reports some relief of the pain after receiving the injection at the previous visit.  Standing for long periods of time increases the pain.  Patient is here for further evaluation and treatment.   Past Medical History:  Diagnosis Date  . Anxiety   . Arthritis   . GERD (gastroesophageal reflux disease)   . PONV (postoperative nausea and vomiting)      Objective: Physical Exam General: The patient is alert and oriented x3 in no acute distress.  Dermatology: Skin is warm, dry and supple bilateral lower extremities. Negative for open lesions or macerations bilateral.   Vascular: Dorsalis Pedis and Posterior Tibial pulses palpable bilateral.  Capillary fill time is immediate to all digits.  Neurological: Epicritic and protective threshold intact bilateral.   Musculoskeletal: Mild tenderness to palpation at the medial calcaneal tubercale and through the insertion of the plantar fascia of the right foot. All other joints range of motion within normal limits bilateral. Strength 5/5 in all groups bilateral.    Assessment: 1. Plantar fasciitis right - improved   Plan of Care:  1.  Patient evaluated.  2.  Orthotics dispensed today. 3.  Recommended good shoe gear. 4.  Continue taking diclofenac by mouth for arthritis pain. 5.  Return to clinic as needed.   Felecia ShellingBrent M. Venida Tsukamoto, DPM [facility]  Dr. Felecia ShellingBrent M. Lettie Czarnecki, DPM    [address]                                        [place], [place] [address]                Office [phone]  Fax [phone]

## 2017-07-18 DIAGNOSIS — Z1231 Encounter for screening mammogram for malignant neoplasm of breast: Secondary | ICD-10-CM | POA: Diagnosis not present

## 2017-08-06 DIAGNOSIS — H5210 Myopia, unspecified eye: Secondary | ICD-10-CM | POA: Diagnosis not present

## 2017-08-10 DIAGNOSIS — J019 Acute sinusitis, unspecified: Secondary | ICD-10-CM | POA: Diagnosis not present

## 2017-08-20 DIAGNOSIS — B373 Candidiasis of vulva and vagina: Secondary | ICD-10-CM | POA: Diagnosis not present

## 2017-11-06 DIAGNOSIS — M1711 Unilateral primary osteoarthritis, right knee: Secondary | ICD-10-CM | POA: Diagnosis not present

## 2017-11-06 DIAGNOSIS — M5106 Intervertebral disc disorders with myelopathy, lumbar region: Secondary | ICD-10-CM | POA: Diagnosis not present

## 2018-01-01 DIAGNOSIS — Z01419 Encounter for gynecological examination (general) (routine) without abnormal findings: Secondary | ICD-10-CM | POA: Diagnosis not present

## 2018-01-01 DIAGNOSIS — Z1389 Encounter for screening for other disorder: Secondary | ICD-10-CM | POA: Diagnosis not present

## 2018-01-01 DIAGNOSIS — Z6841 Body Mass Index (BMI) 40.0 and over, adult: Secondary | ICD-10-CM | POA: Diagnosis not present

## 2018-01-01 DIAGNOSIS — R61 Generalized hyperhidrosis: Secondary | ICD-10-CM | POA: Diagnosis not present

## 2018-01-01 DIAGNOSIS — K649 Unspecified hemorrhoids: Secondary | ICD-10-CM | POA: Diagnosis not present

## 2018-01-01 DIAGNOSIS — Z113 Encounter for screening for infections with a predominantly sexual mode of transmission: Secondary | ICD-10-CM | POA: Diagnosis not present

## 2018-01-01 DIAGNOSIS — Z130 Encounter for screening for diseases of the blood and blood-forming organs and certain disorders involving the immune mechanism: Secondary | ICD-10-CM | POA: Diagnosis not present

## 2018-01-01 DIAGNOSIS — R69 Illness, unspecified: Secondary | ICD-10-CM | POA: Diagnosis not present

## 2018-10-14 IMAGING — MG 2D DIGITAL DIAGNOSTIC UNILATERAL LEFT MAMMOGRAM WITH CAD AND ADJ
8 of 12 series · 8 of 28 positions shown · non-contrast
Comparison: Mammography 07/03/2016, 06/07/2015 and earlier from
[REDACTED] in [HOSPITAL], [place] [HOSPITAL].

CLINICAL DATA: 46-year-old presenting with a possible palpable lump
in the lower outer quadrant of the left breast on recent clinical
examination. Patient also states that she feels a lump in the upper
inner quadrant of the left breast.

Bilateral reduction mammoplasty in 3777. Family history of breast
cancer in her maternal grandmother age late 50s.
EXAM:
2D DIGITAL DIAGNOSTIC LEFT MAMMOGRAM WITH CAD AND ADJUNCT TOMO
ULTRASOUND LEFT BREAST

[L TAN synth-2D (1 of 2)]
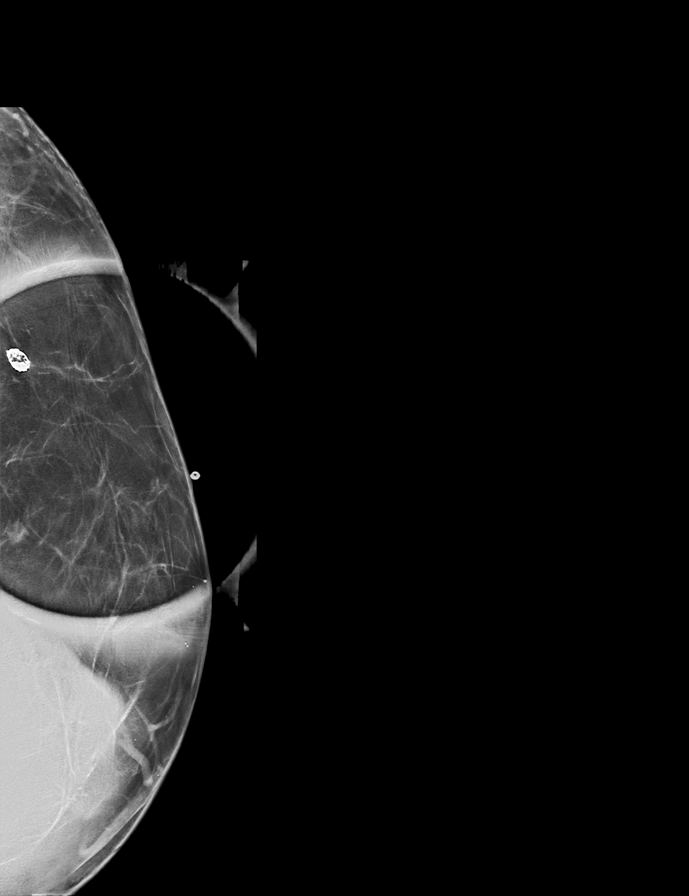

[L MLO synth-2D]
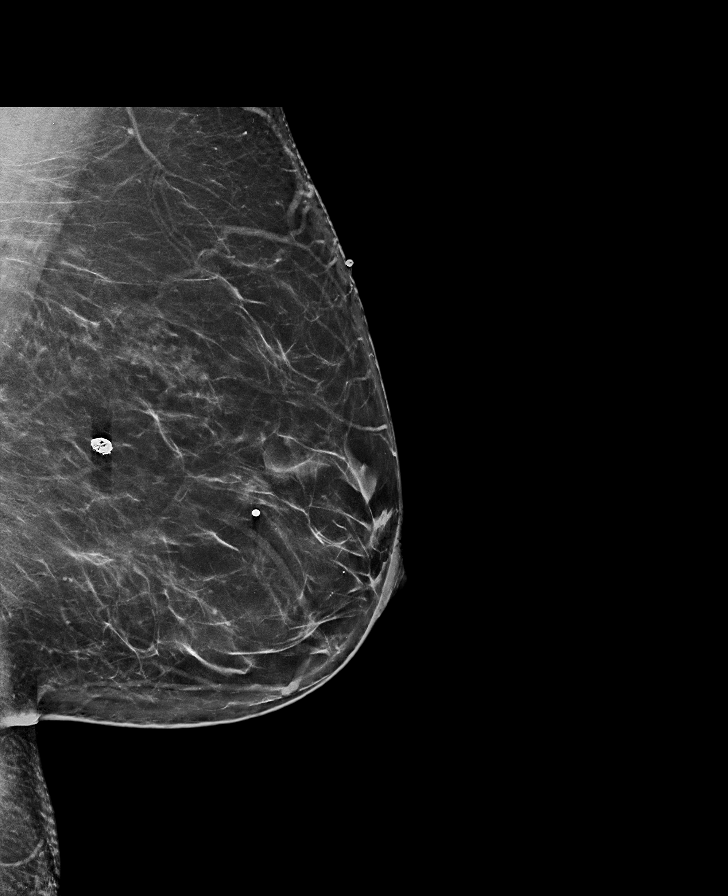

[L TAN (1 of 2)]
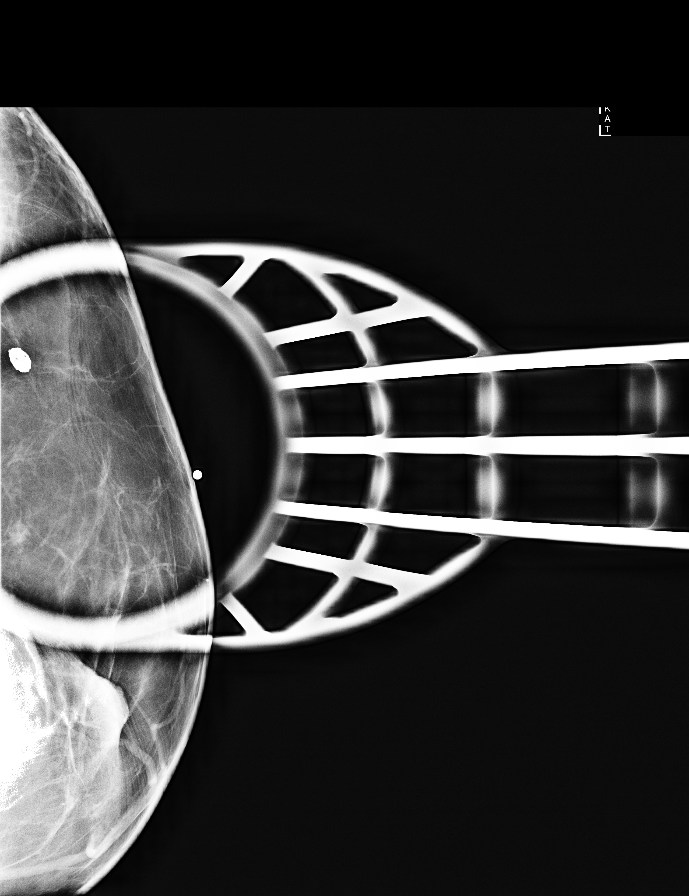

[L TAN synth-2D (2 of 2)]
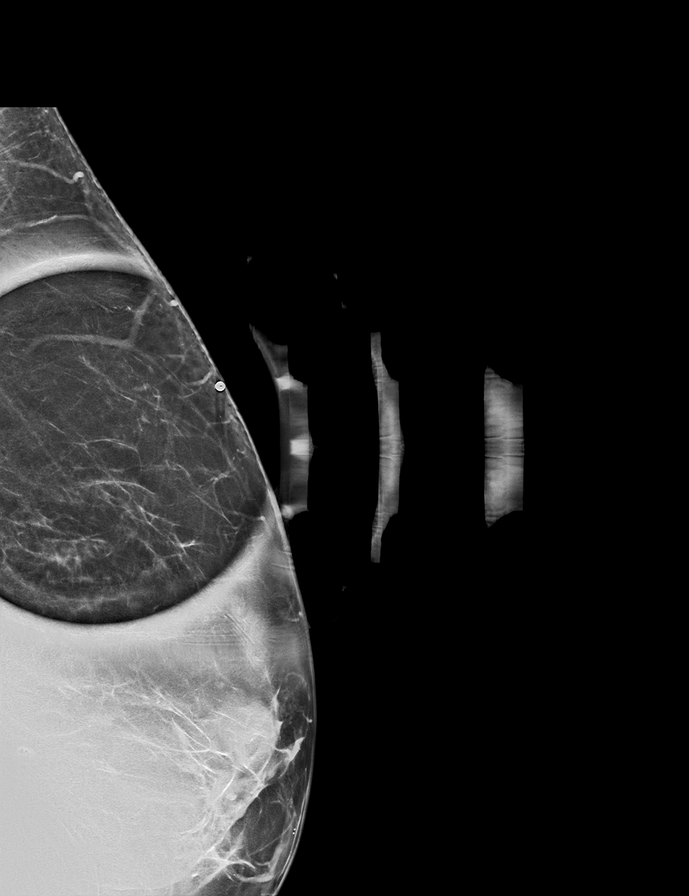

[L CC]
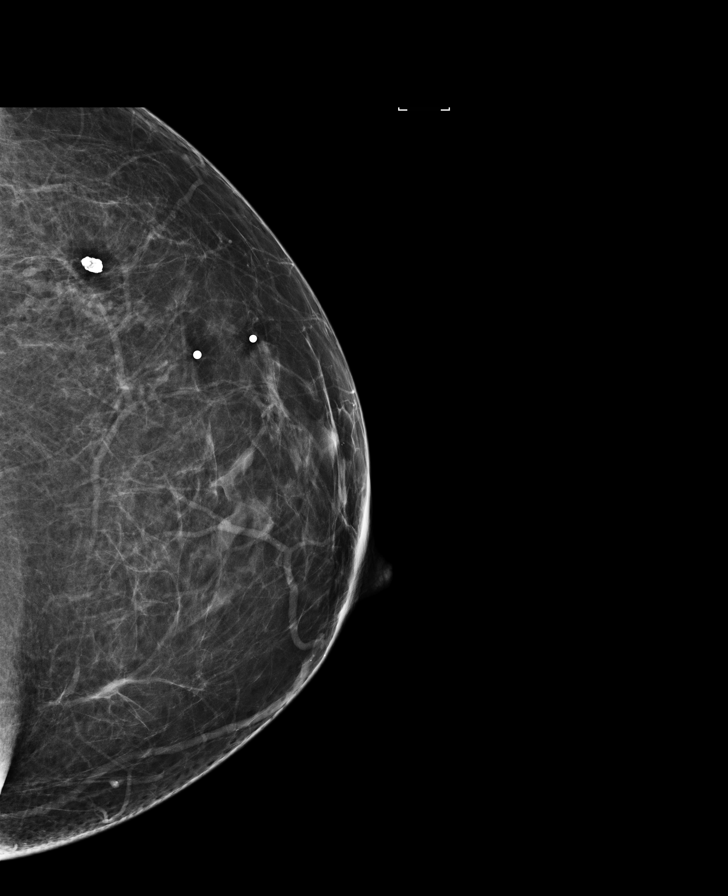

[L CC synth-2D]
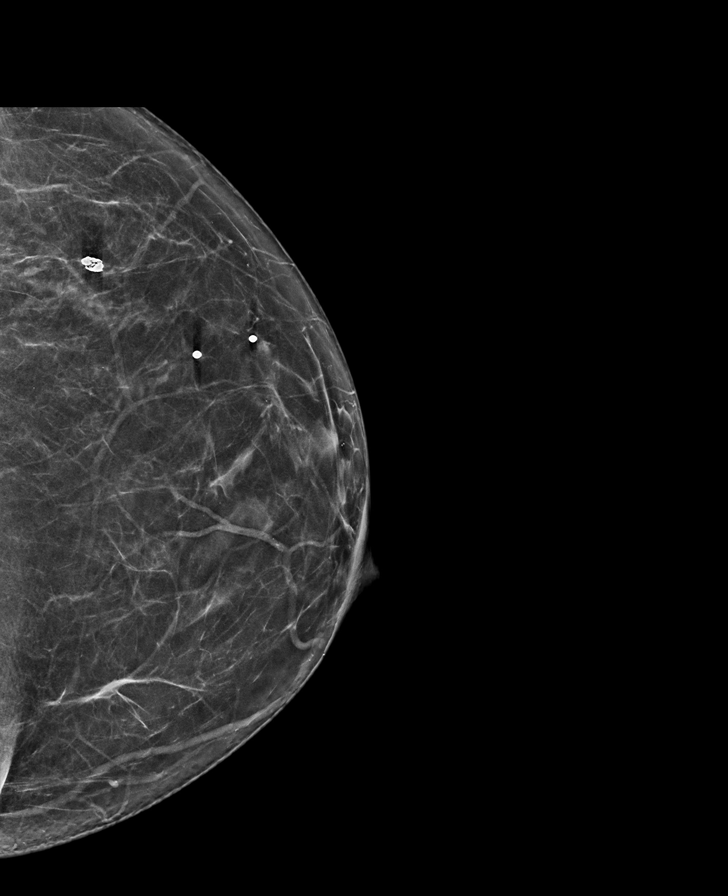

[L TAN (2 of 2)]
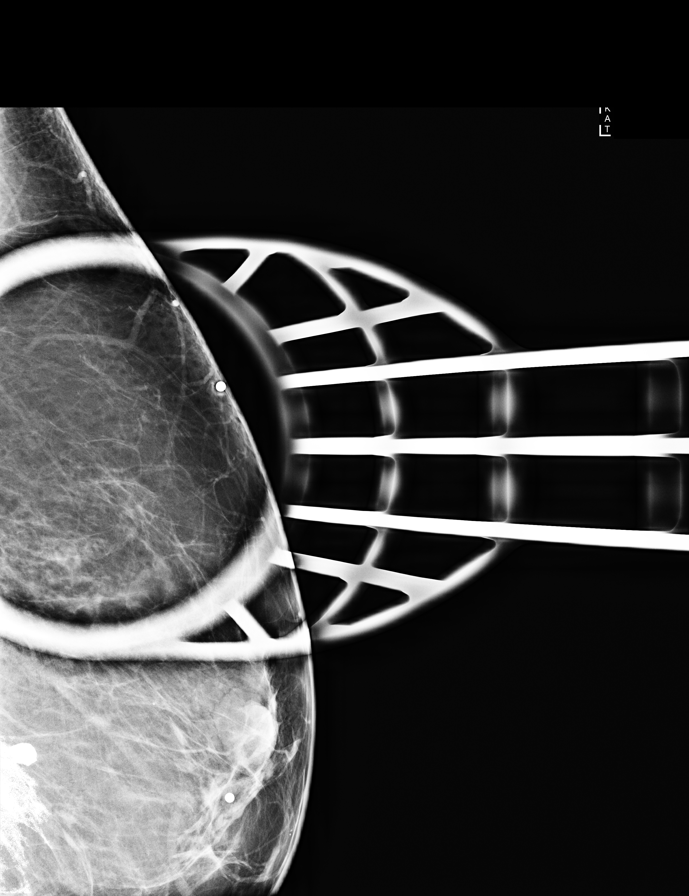

[L MLO]
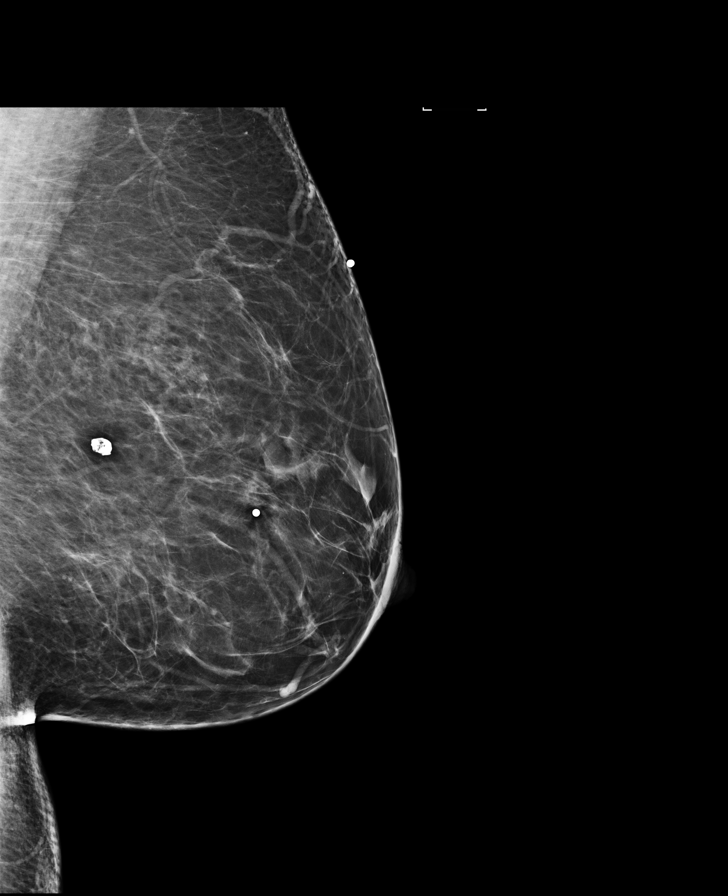

[8 of 28 positions shown; findings below may reference images not displayed]

ACR Breast Density Category b: There are scattered areas of
fibroglandular density.
FINDINGS: Standard 2D and tomosynthesis full field CC and MLO views of the
left breast were obtained. Standard and tomosynthesis spot
tangential views of the areas of palpable concern in the left breast
were also obtained.

No mammographic abnormality in the area of palpable concern in the
lower outer quadrant, other than what is likely scar from the prior
reduction mammoplasty. No mammographic abnormality in the area of
palpable concern in the upper inner quadrant.

No findings suspicious for malignancy in the left breast.

Mammographic images were processed with CAD.

On physical exam, there is a palpable ridge of tissue in the upper
outer quadrant of the left breast corresponding with patient's
feeling. There is a faint pea-sized palpable lump in the lower outer
quadrant.

Targeted left breast ultrasound is performed, showing normal
fibrofatty and scattered fibroglandular tissue in the upper outer
quadrant in the area of palpable concern at the 12:30 o'clock
position approximately 7 cm from the nipple. A benign coarse
dystrophic calcification is present in the lower outer quadrant in
the area of palpable concern at the 4 o'clock position approximately
7 cm from the nipple, corresponding to a densely calcified oil cyst
at the site of the reduction mammoplasty scar on the mammogram. No
suspicious solid mass or abnormal acoustic shadowing is identified.
IMPRESSION: 1. No mammographic evidence of malignancy involving the left breast.
2. Benign coarse dystrophic calcification associated with an oil
cyst involving the scar related to the prior reduction mammoplasty
which may account for the area of palpable concern in the lower
outer quadrant.

RECOMMENDATION:
Annual bilateral screening mammography which is due in June 2017.

I have discussed the findings and recommendations with the patient.
Results were also provided in writing at the conclusion of the
visit. If applicable, a reminder letter will be sent to the patient
regarding the next appointment.

BI-RADS CATEGORY  2: Benign.

## 2020-11-09 ENCOUNTER — Other Ambulatory Visit: Payer: Self-pay | Admitting: Family Medicine

## 2020-11-09 ENCOUNTER — Ambulatory Visit
Admission: RE | Admit: 2020-11-09 | Discharge: 2020-11-09 | Disposition: A | Discharge status: Home / Self Care | Payer: 59 | Source: Ambulatory Visit | Attending: Family Medicine | Admitting: Family Medicine

## 2020-11-09 DIAGNOSIS — U099 Post covid-19 condition, unspecified: Secondary | ICD-10-CM

## 2020-11-09 DIAGNOSIS — R0789 Other chest pain: Secondary | ICD-10-CM

## 2020-11-09 DIAGNOSIS — R0609 Other forms of dyspnea: Secondary | ICD-10-CM

## 2020-11-09 DIAGNOSIS — R052 Subacute cough: Secondary | ICD-10-CM

## 2021-09-19 ENCOUNTER — Ambulatory Visit: Payer: 59 | Admitting: Neurology

## 2021-10-11 ENCOUNTER — Ambulatory Visit: Payer: 59 | Admitting: Neurology

## 2021-11-13 ENCOUNTER — Telehealth: Payer: Self-pay | Admitting: Neurology

## 2021-11-13 ENCOUNTER — Encounter: Payer: Self-pay | Admitting: Neurology

## 2021-11-13 ENCOUNTER — Ambulatory Visit (INDEPENDENT_AMBULATORY_CARE_PROVIDER_SITE_OTHER): Payer: 59 | Admitting: Neurology

## 2021-11-13 VITALS — BP 146/88 | Ht 64.0 in | Wt 275.5 lb

## 2021-11-13 DIAGNOSIS — R269 Unspecified abnormalities of gait and mobility: Secondary | ICD-10-CM | POA: Diagnosis not present

## 2021-11-13 DIAGNOSIS — R200 Anesthesia of skin: Secondary | ICD-10-CM | POA: Diagnosis not present

## 2021-11-13 DIAGNOSIS — R202 Paresthesia of skin: Secondary | ICD-10-CM | POA: Diagnosis not present

## 2021-11-13 NOTE — Telephone Encounter (Signed)
[employer] sent to [facility] they obtain auth [id number] 
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
## 2021-12-05 ENCOUNTER — Ambulatory Visit
Admission: RE | Admit: 2021-12-05 | Discharge: 2021-12-05 | Disposition: A | Discharge status: Home / Self Care | Payer: 59 | Source: Ambulatory Visit | Attending: Neurology | Admitting: Neurology

## 2021-12-05 DIAGNOSIS — R200 Anesthesia of skin: Secondary | ICD-10-CM | POA: Diagnosis not present

## 2021-12-05 DIAGNOSIS — R269 Unspecified abnormalities of gait and mobility: Secondary | ICD-10-CM | POA: Diagnosis not present

## 2021-12-07 ENCOUNTER — Encounter: Payer: Self-pay | Admitting: Neurology

## 2021-12-07 ENCOUNTER — Other Ambulatory Visit: Payer: Self-pay | Admitting: Neurology

## 2021-12-07 DIAGNOSIS — M79641 Pain in right hand: Secondary | ICD-10-CM

## 2022-02-07 ENCOUNTER — Encounter: Payer: 59 | Admitting: Neurology
